# Patient Record
Sex: Male | Born: 2000 | Race: Black or African American | Hispanic: No | Marital: Single | Smoking: Never smoker
Health system: Southern US, Community
[De-identification: ages and names within clinical notes are randomized; demographics above are authoritative.]

---
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
## 2019-05-07 ENCOUNTER — Emergency Department: Payer: Self-pay

## 2019-05-07 ENCOUNTER — Other Ambulatory Visit: Payer: Self-pay

## 2019-05-07 DIAGNOSIS — S61452A Open bite of left hand, initial encounter: Secondary | ICD-10-CM | POA: Insufficient documentation

## 2019-05-07 DIAGNOSIS — Z23 Encounter for immunization: Secondary | ICD-10-CM | POA: Insufficient documentation

## 2019-05-07 DIAGNOSIS — Y92008 Other place in unspecified non-institutional (private) residence as the place of occurrence of the external cause: Secondary | ICD-10-CM | POA: Insufficient documentation

## 2019-05-07 DIAGNOSIS — Y9389 Activity, other specified: Secondary | ICD-10-CM | POA: Insufficient documentation

## 2019-05-07 DIAGNOSIS — W540XXA Bitten by dog, initial encounter: Secondary | ICD-10-CM | POA: Insufficient documentation

## 2019-05-07 DIAGNOSIS — Y999 Unspecified external cause status: Secondary | ICD-10-CM | POA: Insufficient documentation

## 2019-05-07 NOTE — ED Triage Notes (Addendum)
Pt with dog bite to left hand from his own dog that is up to date on all vaccinations. Superficial Lac noted to top of left hand with some small abrasions. Pt did report it to animal control.

## 2019-05-08 ENCOUNTER — Emergency Department
Admission: EM | Admit: 2019-05-08 | Discharge: 2019-05-08 | Disposition: A | Discharge status: Home / Self Care | Payer: Self-pay | Attending: Emergency Medicine | Admitting: Emergency Medicine

## 2019-05-08 DIAGNOSIS — S61452A Open bite of left hand, initial encounter: Secondary | ICD-10-CM

## 2019-05-08 MED ORDER — TETANUS-DIPHTH-ACELL PERTUSSIS 5-2.5-18.5 LF-MCG/0.5 IM SUSP
0.5000 mL | Freq: Once | INTRAMUSCULAR | Status: AC
  Administered 2019-05-08: 03:00:00 0.5 mL via INTRAMUSCULAR
  Filled 2019-05-08: qty 0.5

## 2019-05-08 MED ORDER — IBUPROFEN 600 MG PO TABS
600.0000 mg | ORAL_TABLET | Freq: Once | ORAL | Status: AC
  Administered 2019-05-08: 03:00:00 600 mg via ORAL
  Filled 2019-05-08: qty 1

## 2019-05-08 MED ORDER — AMOXICILLIN-POT CLAVULANATE 875-125 MG PO TABS: 1.0000 | ORAL_TABLET | Freq: Two times a day (BID) | ORAL | 0 refills | Status: AC

## 2019-05-08 MED ORDER — AMOXICILLIN-POT CLAVULANATE 875-125 MG PO TABS
1.0000 | ORAL_TABLET | Freq: Once | ORAL | Status: AC
  Administered 2019-05-08: 03:00:00 1 via ORAL
  Filled 2019-05-08: qty 1

## 2019-05-08 NOTE — Discharge Instructions (Signed)
As we discussed, take the antibiotics fully as prescribed.  If you notice redness of the skin, pus, if the skin surrounding the cuts feels hot to the touch or if you develop a fever please return to the emergency room immediately for evaluation.  Also if you have worsening pain, if the tissue in your hand feels hard, or if you notice changes of color of your fingers (bluish or pale discoloration) also return to the emergency room immediately for evaluation.  Otherwise follow-up with your primary care doctor in 3 days for reevaluation.

## 2019-05-08 NOTE — ED Provider Notes (Signed)
[facility] Emergency Department Provider Note  ____________________________________________  Time seen: Approximately 1:56 AM  I have reviewed the triage vital signs and the nursing notes.   HISTORY  Chief Complaint Animal Bite   HPI Danny Garcia is a 18 y.o. male with no significant past medical history who presents for evaluation of dog bite.  Patient reports that he was trying to get his dog out of her own cage after the dog had pooped in the cage when the dog bit him on his L hand.  The dog is a pit bull, fully vaccinated.  Patient reported to animal control.  Patient is right-handed and works at [employer].  Unknown last tetanus shot.  Patient sustained several small puncture wounds to his left hand but no lacerations.  No other injuries.  Is complaining of moderate sharp and throbbing pain that is constant since the bite.  The bite happened just prior to arrival.  PMH None  There are no active problems to display for this patient.   No past surgical history on file.  Allergies Patient has no known allergies.  No family history on file.  Social History Social History   Tobacco Use  . Smoking status: Never Smoker  . Smokeless tobacco: Never Used  Substance Use Topics  . Alcohol use: Not on file  . Drug use: Not on file    Review of Systems  Constitutional: Negative for fever. Eyes: Negative for visual changes. ENT: Negative for sore throat. Neck: No neck pain  Cardiovascular: Negative for chest pain. Respiratory: Negative for shortness of breath. Gastrointestinal: Negative for abdominal pain, vomiting or diarrhea. Genitourinary: Negative for dysuria. Musculoskeletal: Negative for back pain. Skin: Negative for rash. + dog bite Neurological: Negative for headaches, weakness or numbness. Psych: No SI or HI  ____________________________________________   PHYSICAL EXAM:  VITAL SIGNS: ED Triage Vitals  Enc Vitals Group     BP  05/07/19 2208 132/72     Pulse Rate 05/07/19 2208 65     Resp 05/07/19 2208 16     Temp 05/07/19 2208 98.5 F (36.9 C)     Temp Source 05/07/19 2208 Oral     SpO2 05/07/19 2208 99 %     Weight 05/07/19 2209 158 lb (71.7 kg)     Height 05/07/19 2209 6' (1.829 m)     Head Circumference --      Peak Flow --      Pain Score 05/07/19 2217 10     Pain Loc --      Pain Edu? --      Excl. in [place]? --     Constitutional: Alert and oriented. Well appearing and in no apparent distress. HEENT:      Head: Normocephalic and atraumatic.         Eyes: Conjunctivae are normal. Sclera is non-icteric.       Mouth/Throat: Mucous membranes are moist.       Neck: Supple with no signs of meningismus. Cardiovascular: Regular rate and rhythm.  Respiratory: Normal respiratory effort.  Musculoskeletal: Several small puncture wounds to the dorsum of the L hand and 3rd and 4th fingers, swelling of the dorsum of the hand, no erythema. Nontender with normal range of motion in all extremities. No edema, cyanosis, or erythema of extremities. Neurologic: Normal speech and language. Face is symmetric. Moving all extremities. No gross focal neurologic deficits are appreciated. Skin: Skin is warm, dry and intact. No rash noted. Psychiatric: Mood and affect  are normal. Speech and behavior are normal.  ____________________________________________   LABS (all labs ordered are listed, but only abnormal results are displayed)  Labs Reviewed - No data to display ____________________________________________  EKG  none  ____________________________________________  RADIOLOGY  I have personally reviewed the images performed during this visit and I agree with the Radiologist's read.   Interpretation by Radiologist:  Dg Hand Complete Left  Result Date: 05/07/2019 CLINICAL DATA:  Dog bite injury EXAM: LEFT HAND - COMPLETE 3+ VIEW COMPARISON:  None. FINDINGS: There is no evidence of fracture or dislocation. There is  no evidence of arthropathy or other focal bone abnormality. Soft tissues are unremarkable. IMPRESSION: Negative. Electronically Signed   By: Jasmine Pang M.D.   On: 05/07/2019 22:46     ____________________________________________   PROCEDURES  Procedure(s) performed: None Procedures Critical Care performed: n None ____________________________________________   INITIAL IMPRESSION / ASSESSMENT AND PLAN / ED COURSE  18 y.o. male with no significant past medical history who presents for evaluation of dog bite.  Patient with several puncture wounds to the dorsum of the left hand, third and fourth finger with no lacerations that require any stitching.  Wounds were washed thoroughly and disinfected.  Patient was started on Augmentin.  Discussed wound care and extremely close monitoring with patient for any signs of compartment syndrome or infection.  Wound was dressed.  Dog belongs to the patient with up-to-date vaccinations.  Animal control is aware.       As part of my medical decision making, I reviewed the following data within the electronic MEDICAL RECORD NUMBER Nursing notes reviewed and incorporated, Old chart reviewed, Radiograph reviewed , Notes from prior ED visits and [place] Controlled Substance Database   Patient was evaluated in Emergency Department today for the symptoms described in the history of present illness. Patient was evaluated in the context of the global COVID-19 pandemic, which necessitated consideration that the patient might be at risk for infection with the SARS-CoV-2 virus that causes COVID-19. Institutional protocols and algorithms that pertain to the evaluation of patients at risk for COVID-19 are in a state of rapid change based on information released by regulatory bodies including the CDC and federal and state organizations. These policies and algorithms were followed during the patient's care in the ED.   ____________________________________________   FINAL  CLINICAL IMPRESSION(S) / ED DIAGNOSES   Final diagnoses:  Dog bite of left hand, initial encounter      NEW MEDICATIONS STARTED DURING THIS VISIT:  ED Discharge Orders         Ordered    amoxicillin-clavulanate (AUGMENTIN) 875-125 MG tablet  2 times daily     05/08/19 0203           Note:  This document was prepared using Dragon voice recognition software and may include unintentional dictation errors.    Nita Sickle, MD 05/08/19 [phone]

## 2020-07-29 ENCOUNTER — Other Ambulatory Visit: Payer: Self-pay

## 2020-10-10 IMAGING — CR DG HAND COMPLETE 3+V*L*
1 series · 3 of 3 positions shown · non-contrast
Comparison: None.

CLINICAL DATA: Dog bite injury

EXAM:
LEFT HAND - COMPLETE 3+ VIEW

[Series 1: dg hand complete left · 0.14mm/px · 3 of 3 slices shown]
[im 1/3]
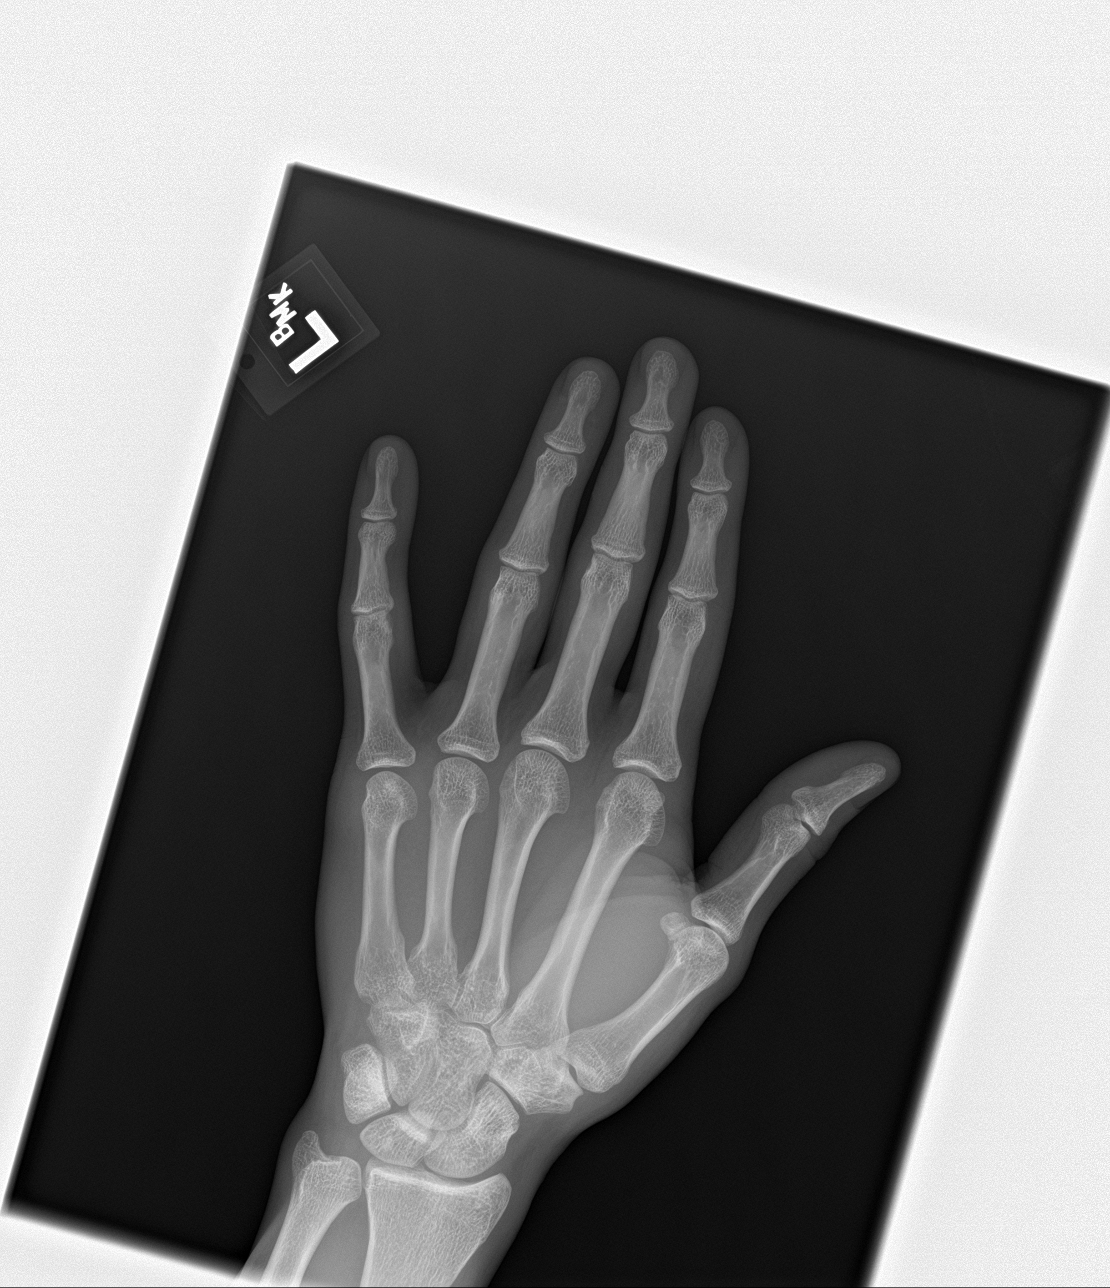
[im 2/3]
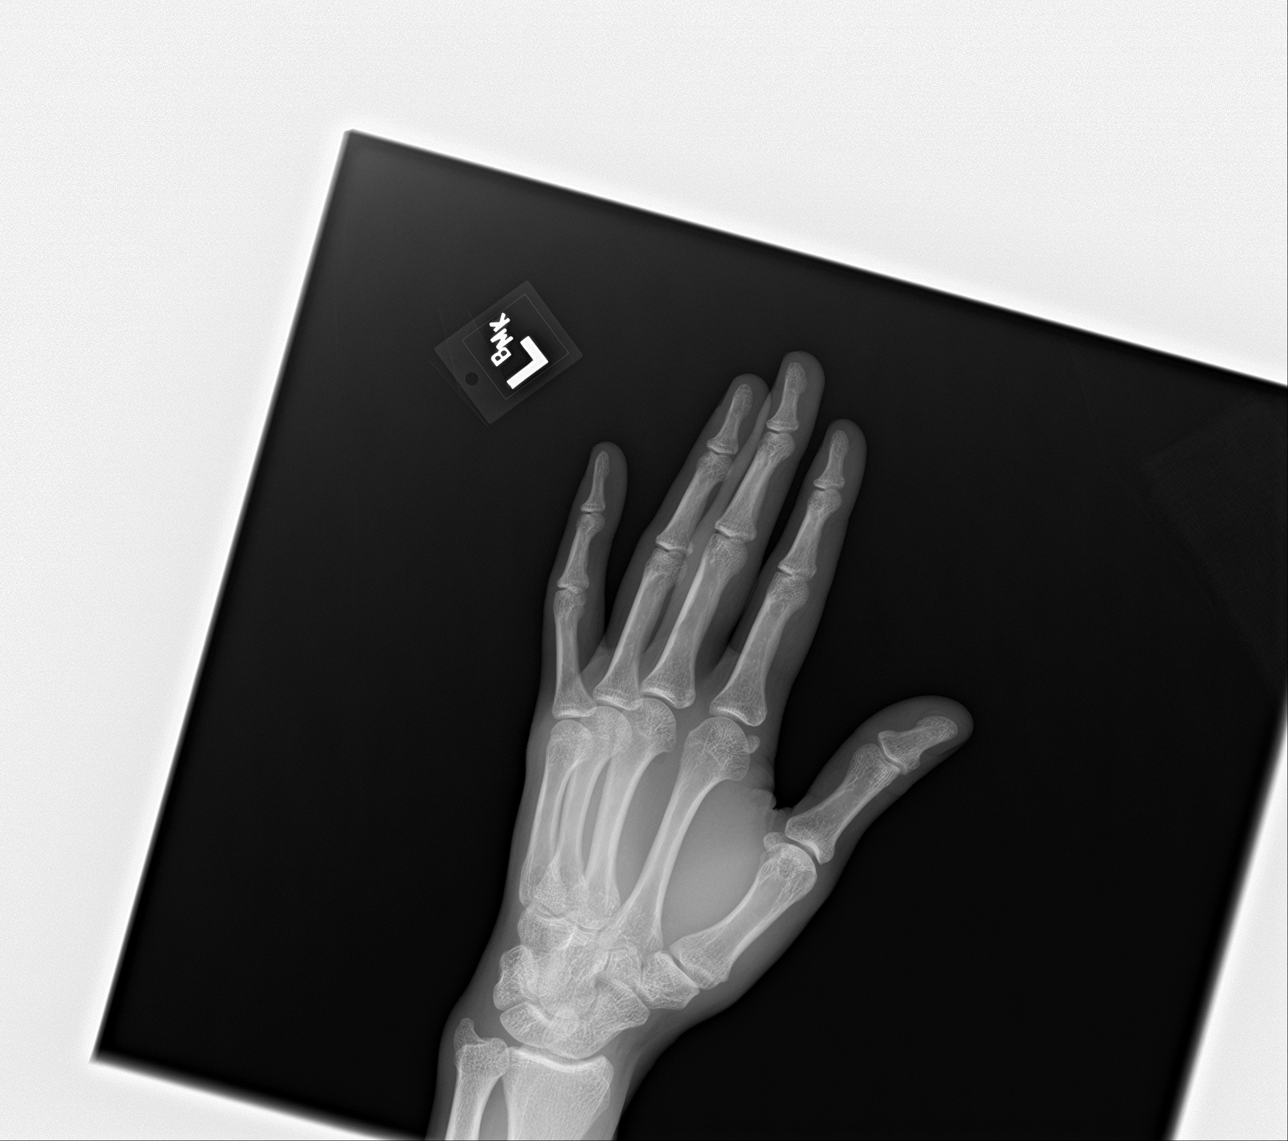
[im 3/3]
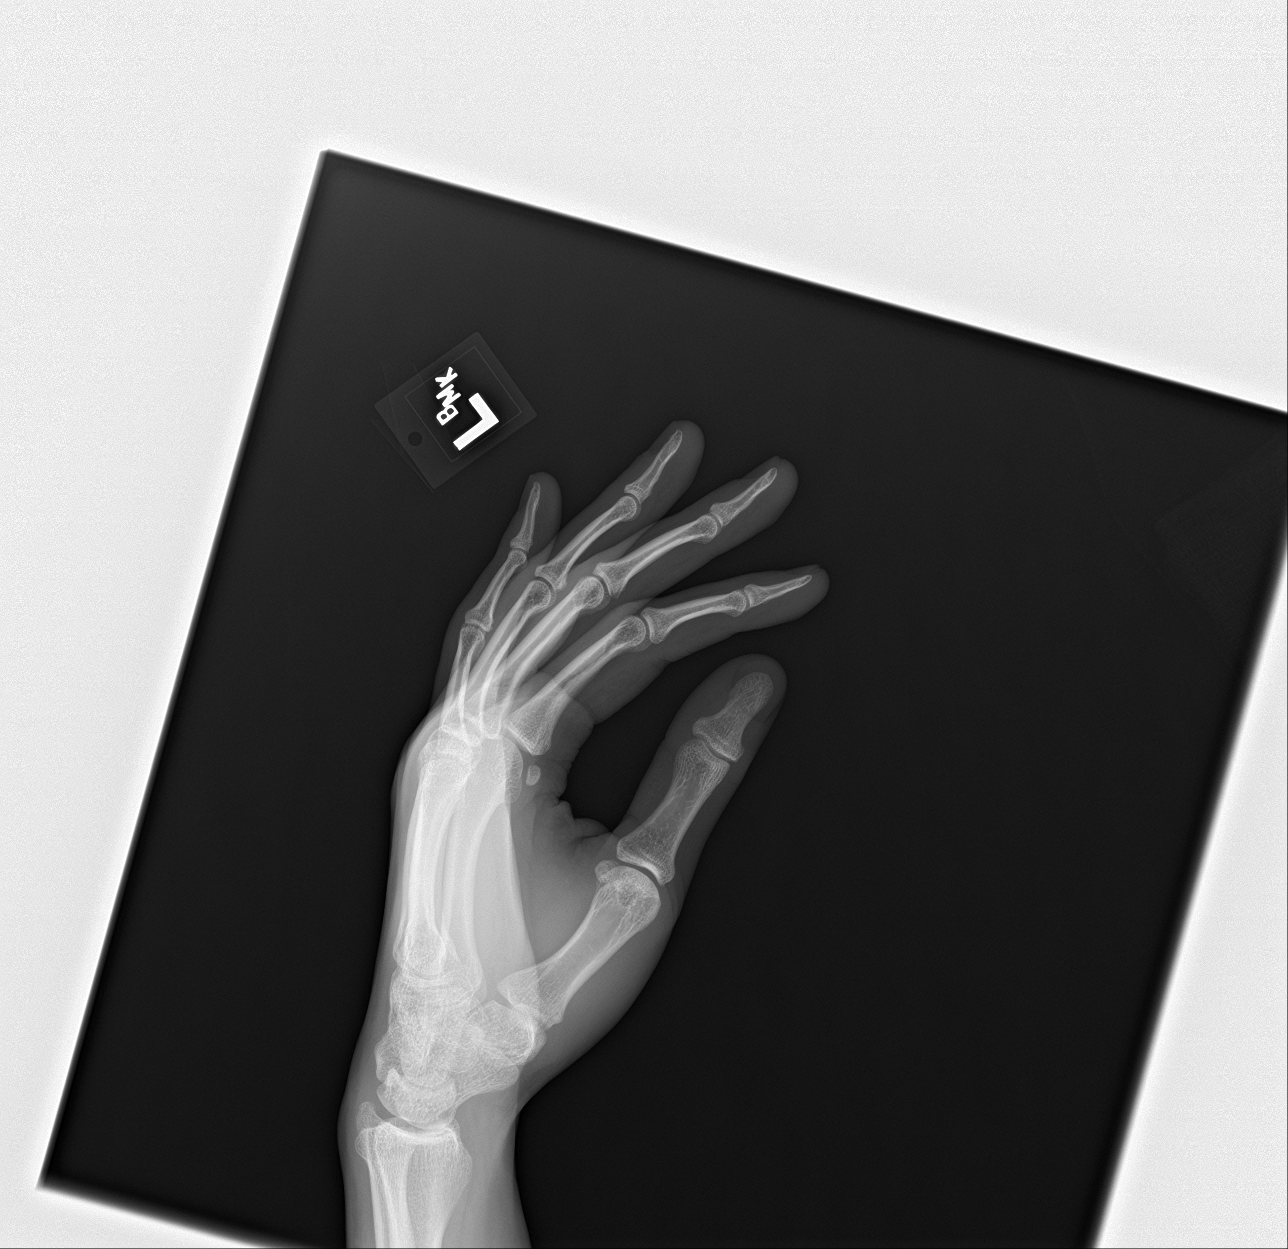

[3 of 3 positions shown; findings below may reference images not displayed]

FINDINGS: There is no evidence of fracture or dislocation. There is no
evidence of arthropathy or other focal bone abnormality. Soft
tissues are unremarkable.
IMPRESSION: Negative.

## 2022-03-04 ENCOUNTER — Emergency Department: Payer: Self-pay

## 2022-03-04 ENCOUNTER — Other Ambulatory Visit: Payer: Self-pay

## 2022-03-04 ENCOUNTER — Emergency Department
Admission: EM | Admit: 2022-03-04 | Discharge: 2022-03-04 | Disposition: A | Discharge status: Home / Self Care | Payer: Self-pay | Attending: Emergency Medicine | Admitting: Emergency Medicine

## 2022-03-04 ENCOUNTER — Ambulatory Visit
Admission: EM | Admit: 2022-03-04 | Discharge: 2022-03-04 | Disposition: A | Discharge status: Home / Self Care | Payer: Self-pay

## 2022-03-04 ENCOUNTER — Encounter: Payer: Self-pay | Admitting: Emergency Medicine

## 2022-03-04 DIAGNOSIS — I10 Essential (primary) hypertension: Secondary | ICD-10-CM | POA: Insufficient documentation

## 2022-03-04 DIAGNOSIS — M25561 Pain in right knee: Secondary | ICD-10-CM | POA: Insufficient documentation

## 2022-03-04 NOTE — Discharge Instructions (Addendum)
Alternate Tylenol and ibuprofen for pain. Please make follow-up appointment with orthopedics if symptoms persist. Apply ice and keep knee elevated at home.

## 2022-03-04 NOTE — ED Triage Notes (Signed)
Patient to ED via POV for right knee pain. Patient states he hyperextended it playing basketball. Ambulatory to triage.

## 2022-03-04 NOTE — ED Provider Triage Note (Signed)
Emergency Medicine Provider Triage Evaluation Note  Danny Garcia , a 21 y.o. male  was evaluated in triage.  Pt complains of hyperextension injury to R knee.  Patient was playing basketball roughly a week ago when he landed awkwardly hyperextending the knee.  He had some pain along the lateral joint line.  No other injury or complaint.  No history of previous injuries to the knee..  Review of Systems  Positive: Right knee pain/injury Negative: Open wounds, other injury or complaint  Physical Exam  BP (!) 152/121 Comment: pt states he nervous arounds doctors  Pulse 88   Temp 98.1 F (36.7 C) (Oral)   Resp 18   SpO2 100%  Gen:   Awake, no distress   Resp:  Normal effort  MSK:   Moves extremities without difficulty.  Slight tenderness along the lateral joint line with minimal edema.  No open wounds.   Other:    Medical Decision Making  Medically screening exam initiated at 3:32 PM.  Appropriate orders placed.  Danny Garcia was informed that the remainder of the evaluation will be completed by another provider, this initial triage assessment does not replace that evaluation, and the importance of remaining in the ED until their evaluation is complete.  Knee injury, xray ordered   Racheal Patches, PA-C 03/04/22 1534

## 2022-03-04 NOTE — ED Provider Notes (Signed)
[facility] Provider Note  Patient Contact: 5:13 PM (approximate)   History   Knee Pain   HPI  Danny Garcia is a 21 y.o. male presents to the emergency department with acute right knee pain after a hyperextension type injury that occurred while running.  Patient states that he has been able to bear weight and has had no prior right knee surgeries in the past.  No numbness or tingling in the bilateral lower extremities.      Physical Exam   Triage Vital Signs: ED Triage Vitals  Enc Vitals Group     BP 03/04/22 1531 (!) 152/121     Pulse Rate 03/04/22 1531 88     Resp 03/04/22 1531 18     Temp 03/04/22 1531 98.1 F (36.7 C)     Temp Source 03/04/22 1531 Oral     SpO2 03/04/22 1531 100 %     Weight 03/04/22 1532 170 lb (77.1 kg)     Height 03/04/22 1532 5\' 11"  (1.803 m)     Head Circumference --      Peak Flow --      Pain Score 03/04/22 1531 4     Pain Loc --      Pain Edu? --      Excl. in [place]? --     Most recent vital signs: Vitals:   03/04/22 1531  BP: (!) 152/121  Pulse: 88  Resp: 18  Temp: 98.1 F (36.7 C)  SpO2: 100%     General: Alert and in no acute distress. Eyes:  PERRL. EOMI. Head: No acute traumatic findings ENT:      Nose: No congestion/rhinnorhea.      Mouth/Throat: Mucous membranes are moist. Neck: No stridor. No cervical spine tenderness to palpation. Cardiovascular:  Good peripheral perfusion Respiratory: Normal respiratory effort without tachypnea or retractions. Lungs CTAB. Good air entry to the bases with no decreased or absent breath sounds. Gastrointestinal: Bowel sounds 4 quadrants. Soft and nontender to palpation. No guarding or rigidity. No palpable masses. No distention. No CVA tenderness. Musculoskeletal: No deficits appreciated with provocative testing of the right knee.  Palpable dorsalis pedis pulse bilaterally and symmetrically.  Capillary refill less than 2 seconds on the right. Neurologic:  No  gross focal neurologic deficits are appreciated.  Skin:   No rash noted Other:   ED Results / Procedures / Treatments   Labs (all labs ordered are listed, but only abnormal results are displayed) Labs Reviewed - No data to display      RADIOLOGY  I personally viewed and evaluated these images as part of my medical decision making, as well as reviewing the written report by the radiologist.  ED Provider Interpretation: No acute bony abnormality was visualized of the right knee.   PROCEDURES:  Critical Care performed: No  Procedures   MEDICATIONS ORDERED IN ED: Medications - No data to display   IMPRESSION / MDM / ASSESSMENT AND PLAN / ED COURSE  I reviewed the triage vital signs and the nursing notes.                              Assessment and plan Right knee pain 21 year old male presents to the emergency department with acute right knee pain.  Patient was hypertensive at triage but vital signs otherwise reassuring.  He was alert and nontoxic-appearing with full range of motion at the right knee.  X-ray of  the right knee unremarkable.  Will place patient in a knee sleeve and recommend Tylenol and naproxen alternating for pain.  Recommended follow-up with orthopedics if symptoms persist.      FINAL CLINICAL IMPRESSION(S) / ED DIAGNOSES   Final diagnoses:  Acute pain of right knee     Rx / DC Orders   ED Discharge Orders     None        Note:  This document was prepared using Dragon voice recognition software and may include unintentional dictation errors.   Pia Mau [place], PA-C 03/04/22 1715    Minna Antis, MD 03/04/22 [phone]

## 2022-03-04 NOTE — ED Notes (Signed)
See triage note  Presents with right knee pain  States he did not fall  but hyperextended it   No swelling noted or deformity

## 2022-03-05 ENCOUNTER — Ambulatory Visit: Payer: Self-pay

## 2023-10-17 ENCOUNTER — Ambulatory Visit
Admission: EM | Admit: 2023-10-17 | Discharge: 2023-10-17 | Disposition: A | Discharge status: Home / Self Care | Attending: Emergency Medicine | Admitting: Emergency Medicine

## 2023-10-17 DIAGNOSIS — M545 Low back pain, unspecified: Secondary | ICD-10-CM

## 2023-10-17 MED ORDER — METHOCARBAMOL 500 MG PO TABS: 500.0000 mg | ORAL_TABLET | Freq: Two times a day (BID) | ORAL | 0 refills | Status: AC | PRN

## 2023-10-17 NOTE — Discharge Instructions (Addendum)
Take ibuprofen as needed for discomfort.  Take the muscle relaxer as needed for muscle spasm; Do not drive, operate machinery, or drink alcohol with this medication as it can cause drowsiness.   Follow up with your primary care provider or an orthopedist if your symptoms are not improving.

## 2023-10-17 NOTE — ED Triage Notes (Signed)
 Patient to Urgent Care with complaints of lower back pain. Denies radiation.  Symptoms started yesterday after playing basketball. States his back tensed up after turning. Had to call out of work today due to pain.   Reports similar issue last year.

## 2023-10-17 NOTE — ED Provider Notes (Signed)
 Danny Garcia    CSN: [id number] Arrival date & time: 10/17/23  1557      History   Chief Complaint Chief Complaint  Patient presents with   Back Pain    HPI Danny Garcia is a 23 y.o. male.  Patient presents with bilateral low back pain x 1 day.  His symptoms started after he was playing basketball yesterday.  He feels like his muscles tensed up.  No trauma.  The pain is nonradiating.  He has been treating his symptoms with ibuprofen.  He denies numbness, weakness, paresthesias, saddle anesthesia, loss of bowel/bladder control, abdominal pain, dysuria, hematuria.  He was not able to go to work today due to his back pain and needs a note.  The history is provided by the patient and medical records.    History reviewed. No pertinent past medical history.  There are no active problems to display for this patient.   History reviewed. No pertinent surgical history.     Home Medications    Prior to Admission medications   Medication Sig Start Date End Date Taking? Authorizing Provider  methocarbamol (ROBAXIN) 500 MG tablet Take 1 tablet (500 mg total) by mouth 2 (two) times daily as needed for muscle spasms. 10/17/23  Yes Mickie Bail, NP    Family History History reviewed. No pertinent family history.  Social History Social History   Tobacco Use   Smoking status: Never   Smokeless tobacco: Never  Substance Use Topics   Alcohol use: Not Currently   Drug use: Never     Allergies   Patient has no known allergies.   Review of Systems Review of Systems  Constitutional:  Negative for chills and fever.  Genitourinary:  Negative for dysuria and hematuria.  Musculoskeletal:  Positive for back pain. Negative for gait problem.  Skin:  Negative for color change, rash and wound.  Neurological:  Negative for weakness and numbness.     Physical Exam Triage Vital Signs ED Triage Vitals  Encounter Vitals Group     BP 10/17/23 1624 131/82     Systolic BP  Percentile --      Diastolic BP Percentile --      Pulse Rate 10/17/23 1624 71     Resp 10/17/23 1624 18     Temp 10/17/23 1624 97.8 F (36.6 C)     Temp src --      SpO2 10/17/23 1624 98 %     Weight --      Height --      Head Circumference --      Peak Flow --      Pain Score 10/17/23 1631 7     Pain Loc --      Pain Education --      Exclude from Growth Chart --    No data found.  Updated Vital Signs BP 131/82   Pulse 71   Temp 97.8 F (36.6 C)   Resp 18   SpO2 98%   Visual Acuity Right Eye Distance:   Left Eye Distance:   Bilateral Distance:    Right Eye Near:   Left Eye Near:    Bilateral Near:     Physical Exam Constitutional:      General: He is not in acute distress. HENT:     Mouth/Throat:     Mouth: Mucous membranes are moist.  Cardiovascular:     Rate and Rhythm: Normal rate and regular rhythm.     Heart  sounds: Normal heart sounds.  Pulmonary:     Effort: Pulmonary effort is normal. No respiratory distress.     Breath sounds: Normal breath sounds.  Abdominal:     General: Bowel sounds are normal.     Palpations: Abdomen is soft.     Tenderness: There is no abdominal tenderness. There is no right CVA tenderness, left CVA tenderness, guarding or rebound.  Musculoskeletal:        General: No swelling, tenderness or deformity. Normal range of motion.  Skin:    Capillary Refill: Capillary refill takes less than 2 seconds.     Findings: No bruising, erythema, lesion or rash.  Neurological:     General: No focal deficit present.     Mental Status: He is alert and oriented to person, place, and time.     Sensory: No sensory deficit.     Motor: No weakness.     Gait: Gait normal.      UC Treatments / Results  Labs (all labs ordered are listed, but only abnormal results are displayed) Labs Reviewed - No data to display  EKG   Radiology No results found.  Procedures Procedures (including critical care time)  Medications Ordered in  UC Medications - No data to display  Initial Impression / Assessment and Plan / UC Course  I have reviewed the triage vital signs and the nursing notes.  Pertinent labs & imaging results that were available during my care of the patient were reviewed by me and considered in my medical decision making (see chart for details).    Acute low back pain without sciatica.  Work note provided per patient request.  Instructed patient to take ibuprofen as needed for discomfort.  Treating with methocarbamol.  Precautions for drowsiness with methocarbamol discussed.  Education provided on acute back pain.  Instructed patient to follow-up with his PCP or an orthopedist if his symptoms are not improving.  Contact information for on-call Ortho provided.  Patient agrees to plan of care.  Final Clinical Impressions(s) / UC Diagnoses   Final diagnoses:  Acute bilateral low back pain without sciatica     Discharge Instructions      Take ibuprofen as needed for discomfort.  Take the muscle relaxer as needed for muscle spasm; Do not drive, operate machinery, or drink alcohol with this medication as it can cause drowsiness.   Follow up with your primary care provider or an orthopedist if your symptoms are not improving.         ED Prescriptions     Medication Sig Dispense Auth. Provider   methocarbamol (ROBAXIN) 500 MG tablet Take 1 tablet (500 mg total) by mouth 2 (two) times daily as needed for muscle spasms. 10 tablet Mickie Bail, NP      I have reviewed the PDMP during this encounter.   Mickie Bail, NP 10/17/23 [phone]

## 2024-02-01 ENCOUNTER — Emergency Department

## 2024-02-01 DIAGNOSIS — S39012A Strain of muscle, fascia and tendon of lower back, initial encounter: Secondary | ICD-10-CM | POA: Insufficient documentation

## 2024-02-01 DIAGNOSIS — X58XXXA Exposure to other specified factors, initial encounter: Secondary | ICD-10-CM | POA: Insufficient documentation

## 2024-02-01 DIAGNOSIS — Y9367 Activity, basketball: Secondary | ICD-10-CM | POA: Diagnosis not present

## 2024-02-01 DIAGNOSIS — S3992XA Unspecified injury of lower back, initial encounter: Secondary | ICD-10-CM | POA: Diagnosis present

## 2024-02-01 NOTE — ED Triage Notes (Signed)
 POV with CC of lower back pain that has been ongoing x3 days. Hx of same with dx of muscle spasms. Denies changes in urination. Ambulatory with steady gait.

## 2024-02-02 ENCOUNTER — Emergency Department
Admission: EM | Admit: 2024-02-02 | Discharge: 2024-02-02 | Disposition: A | Discharge status: Home / Self Care | Attending: Emergency Medicine | Admitting: Emergency Medicine

## 2024-02-02 DIAGNOSIS — S39012A Strain of muscle, fascia and tendon of lower back, initial encounter: Secondary | ICD-10-CM

## 2024-02-02 MED ORDER — LIDOCAINE 5 % EX PTCH: 1.0000 | MEDICATED_PATCH | CUTANEOUS | 0 refills | Status: AC

## 2024-02-02 NOTE — ED Provider Notes (Signed)
 [facility] Provider Note    Event Date/Time   First MD Initiated Contact with Patient 02/02/24 [phone]     (approximate)   History   Back Pain   HPI ROLLAN ROGER is a 23 y.o. male presenting today for back pain.  Patient states having prior history of back spasms in the lower back.  He was playing basketball this week and feels like he agitated the issue.  Having spasms again in his lower back.  Denies any leg weakness or numbness.  Denies any trauma to his back.  Has been using KT tape but no other medications.     Physical Exam   Triage Vital Signs: ED Triage Vitals  Encounter Vitals Group     BP 02/01/24 2100 (!) 152/92     Girls Systolic BP Percentile --      Girls Diastolic BP Percentile --      Boys Systolic BP Percentile --      Boys Diastolic BP Percentile --      Pulse Rate 02/01/24 2100 62     Resp 02/01/24 2100 18     Temp 02/01/24 2100 98.8 F (37.1 C)     Temp Source 02/01/24 2100 Oral     SpO2 02/01/24 2100 100 %     Weight 02/01/24 2101 175 lb (79.4 kg)     Height 02/01/24 2101 5' 11 (1.803 m)     Head Circumference --      Peak Flow --      Pain Score 02/01/24 2100 9     Pain Loc --      Pain Education --      Exclude from Growth Chart --     Most recent vital signs: Vitals:   02/01/24 2100  BP: (!) 152/92  Pulse: 62  Resp: 18  Temp: 98.8 F (37.1 C)  SpO2: 100%   I have reviewed the vital signs. General:  Awake, alert, no acute distress. Head:  Normocephalic, Atraumatic. EENT:  PERRL, EOMI, Oral mucosa pink and moist, Neck is supple. Cardiovascular: Regular rate, 2+ distal pulses. Respiratory:  Normal respiratory effort, symmetrical expansion, no distress.   Extremities:  Moving all four extremities through full ROM without pain.  No numbness or weakness to lower extremities.  Sensation intact throughout.  No tenderness to L-spine Neuro:  Alert and oriented.  Interacting appropriately.   Skin:  Warm, dry, no  rash.   Psych: Appropriate affect.    ED Results / Procedures / Treatments   Labs (all labs ordered are listed, but only abnormal results are displayed) Labs Reviewed - No data to display   EKG    RADIOLOGY Independently interpreted x-ray with no acute pathology   PROCEDURES:  Critical Care performed: No  Procedures   MEDICATIONS ORDERED IN ED: Medications - No data to display   IMPRESSION / MDM / ASSESSMENT AND PLAN / ED COURSE  I reviewed the triage vital signs and the nursing notes.                              Differential diagnosis includes, but is not limited to, muscle spasms, muscle strain  Patient's presentation is most consistent with acute complicated illness / injury requiring diagnostic workup.  Patient is a 23 year old male presenting today for acute on chronic low back pain.  Symptoms appear most consistent with muscle spasms versus muscular strain.  He has no other  acute neurological issues and is well-appearing.  Does not need any immediate medication here in the ER but will discharge home with Lidoderm  patches and regiment of ibuprofen  and Tylenol scheduled.  Discussed return precautions and outpatient follow-up.     FINAL CLINICAL IMPRESSION(S) / ED DIAGNOSES   Final diagnoses:  Strain of lumbar region, initial encounter     Rx / DC Orders   ED Discharge Orders          Ordered    lidocaine  (LIDODERM ) 5 %  Every 24 hours        02/02/24 0119             Note:  This document was prepared using Dragon voice recognition software and may include unintentional dictation errors.   Malvina Alm DASEN, MD 02/02/24 [place]

## 2024-02-02 NOTE — Discharge Instructions (Signed)
 I have sent topical numbing patches to your pharmacy to take as prescribed.  You can also take ibuprofen  600 mg every 8 hours for the next 5 days along with Tylenol 1000 mg every 6 hours.  You can look up online which is easiest for finding exercises to strengthen your lower back to help prevent symptoms in the future.
